# Patient Record
Sex: Female | Born: 1960 | Race: White | Hispanic: No | Marital: Single | Smoking: Never smoker
Health system: Southern US, Community
[De-identification: ages and names within clinical notes are randomized; demographics above are authoritative.]

---

## 2007-04-17 ENCOUNTER — Ambulatory Visit: Payer: Self-pay | Admitting: Gastroenterology

## 2007-05-22 ENCOUNTER — Other Ambulatory Visit: Payer: Self-pay

## 2007-05-22 ENCOUNTER — Ambulatory Visit: Payer: Self-pay | Admitting: General Surgery

## 2007-05-26 ENCOUNTER — Ambulatory Visit: Payer: Self-pay | Admitting: General Surgery

## 2008-01-18 IMAGING — NM NM SENTINAL NODE INJECTION (BREAST) - NO REPORT
1 series · 4 of 4 positions shown · non-contrast
Comparison: none

REASON FOR EXAM: right breast  CA    surg [DATE]
COMMENTS:

[Series 1000: sent node breast static · 2.40mm/px · 2 acquisitions, 4 frames shown]
[im 1/2]
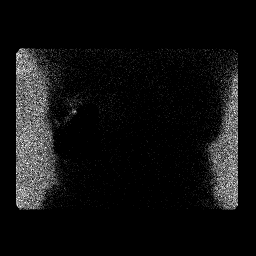
[im 1/2]
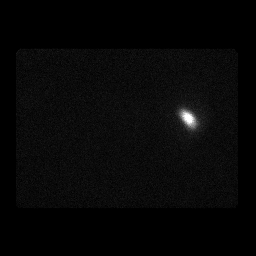
[im 2/2]
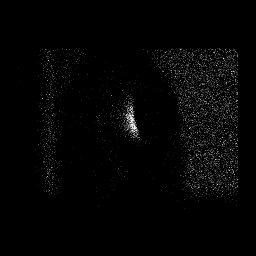
[im 2/2]
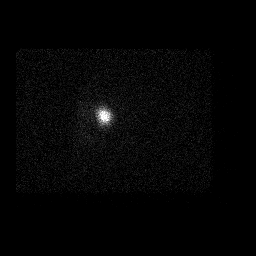

[4 of 4 positions shown; findings below may reference images not displayed]

PROCEDURE:     NM  - NM SENTINEL NODE  BREAST  - May 26, 2007  [DATE]

RESULT:     Comparison: No available comparison exam.

Procedure: Approximately 1.03 mCi technetium 99m sulfur colloid was
administered with single subdermal injection in the lateral peri areolar
region of the right breast. Static images were acquired 15 minutes and 22
minutes after injection.
FINDINGS: Radiotracer accumulation is seen near the injection site.
IMPRESSION: 1. Radiotracer accumulation is seen near the injection site involving the
right breast.

## 2009-02-23 ENCOUNTER — Ambulatory Visit: Payer: Self-pay | Admitting: Ophthalmology

## 2016-06-05 ENCOUNTER — Ambulatory Visit (INDEPENDENT_AMBULATORY_CARE_PROVIDER_SITE_OTHER): Payer: BC Managed Care – PPO

## 2016-06-05 ENCOUNTER — Ambulatory Visit (INDEPENDENT_AMBULATORY_CARE_PROVIDER_SITE_OTHER): Payer: Self-pay | Admitting: Podiatry

## 2016-06-05 ENCOUNTER — Encounter: Payer: Self-pay | Admitting: Podiatry

## 2016-06-05 VITALS — BP 169/95 | HR 70 | Temp 98.2°F | Resp 16 | Ht 61.0 in | Wt 175.0 lb

## 2016-06-05 DIAGNOSIS — R52 Pain, unspecified: Secondary | ICD-10-CM

## 2016-06-05 DIAGNOSIS — M84374A Stress fracture, right foot, initial encounter for fracture: Secondary | ICD-10-CM

## 2016-06-05 DIAGNOSIS — R600 Localized edema: Secondary | ICD-10-CM | POA: Diagnosis not present

## 2016-06-05 NOTE — Progress Notes (Signed)
Patient ID: Caitlin Mosley, female   DOB: [birth year], 58 y.o.   MRN: [id number] Subjective:  Patient presents today as a new patient for evaluation of right foot pain times 1-2 weeks. 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 Patient states that approximately 1-2 weeks ago she started to notice right foot pain. Patient continued to work her normal shifts and she would get intermittent swelling of her right foot. Alleviated by rest. Patient states the pain is progressively gotten worse. Patient presents today for further treatment and evaluation    Objective/Physical Exam General: The patient is alert and oriented x3 in no acute distress.  Dermatology: Skin is warm, dry and supple bilateral lower extremities. Negative for open lesions or macerations.  Vascular: Palpable pedal pulses bilaterally. No edema or erythema noted. Capillary refill within normal limits.  Neurological: Epicritic and protective threshold grossly intact bilaterally.   Musculoskeletal Exam: Severe pain on palpation noted to the base of the second metatarsal right foot. Range of motion within normal limits to all pedal and ankle joints bilateral. Muscle strength 5/5 in all groups bilateral.   Radiographic Exam:  Normal osseous mineralization. Joint spaces preserved. No fracture/dislocation/boney destruction.    Assessment: #1 stress fracture right foot based on clinical exam #2 pain in right foot #3 edema right foot   Plan of Care:  #1 Patient was evaluated. #2 compression anklet dispensed #3 cam boot dispensed #4 continue meloxicam 15 mg #5 discussed the importance of reducing activity and possible time off from work. Patient refuses time off from work at this moment. Recommend immobilization in the cam boot as often as possible. #6 return to clinic in 4 weeks   Edrick Kins, DPM [facility]  Dr. Edrick Kins, [address]                                         [place], [place] [address]                Office [phone]  Fax [phone]

## 2016-06-05 NOTE — Progress Notes (Signed)
   Subjective:    Patient ID: Caitlin Mosley, female    DOB: Oct 06, 1960, 56 y.o.   MRN: [id number]  HPI    Review of Systems  Neurological: Positive for numbness.  All other systems reviewed and are negative.      Objective:   Physical Exam        Assessment & Plan:

## 2016-06-12 DIAGNOSIS — M79673 Pain in unspecified foot: Secondary | ICD-10-CM

## 2016-07-03 ENCOUNTER — Ambulatory Visit (INDEPENDENT_AMBULATORY_CARE_PROVIDER_SITE_OTHER): Payer: BC Managed Care – PPO | Admitting: Podiatry

## 2016-07-03 ENCOUNTER — Ambulatory Visit (INDEPENDENT_AMBULATORY_CARE_PROVIDER_SITE_OTHER): Payer: BC Managed Care – PPO

## 2016-07-03 DIAGNOSIS — M79671 Pain in right foot: Secondary | ICD-10-CM

## 2016-07-03 DIAGNOSIS — M779 Enthesopathy, unspecified: Secondary | ICD-10-CM

## 2016-07-03 DIAGNOSIS — M7751 Other enthesopathy of right foot: Secondary | ICD-10-CM | POA: Diagnosis not present

## 2016-07-03 DIAGNOSIS — M84374D Stress fracture, right foot, subsequent encounter for fracture with routine healing: Secondary | ICD-10-CM

## 2016-07-03 DIAGNOSIS — M778 Other enthesopathies, not elsewhere classified: Secondary | ICD-10-CM

## 2016-07-03 DIAGNOSIS — M84374A Stress fracture, right foot, initial encounter for fracture: Secondary | ICD-10-CM

## 2016-07-14 MED ORDER — BETAMETHASONE SOD PHOS & ACET 6 (3-3) MG/ML IJ SUSP: 3.0000 mg | Freq: Once | INTRAMUSCULAR | Status: AC

## 2016-07-14 NOTE — Progress Notes (Signed)
Patient ID: Caitlin Mosley, female   DOB: 1960/10/14, 56 y.o.   MRN: [id number] Subjective:  Patient presents today for follow-up evaluation of pain to the right midfoot. Patient states they can she continues to have pain and swelling. 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 Pain is alleviated by rest.   Objective/Physical Exam General: The patient is alert and oriented x3 in no acute distress.  Dermatology: Skin is warm, dry and supple bilateral lower extremities. Negative for open lesions or macerations.  Vascular: Palpable pedal pulses bilaterally. No edema or erythema noted. Capillary refill within normal limits.  Neurological: Epicritic and protective threshold grossly intact bilaterally.   Musculoskeletal Exam: Severe pain on palpation noted to the base of the second metatarsal right foot. Range of motion within normal limits to all pedal and ankle joints bilateral. Muscle strength 5/5 in all groups bilateral.    Assessment: #1 stress fracture right foot base of the fourth metatarsal based on clinical exam #2 pain in right foot #3 edema right foot   Plan of Care:  #1 Patient was evaluated. #2 continue compression anklet. Continue cam boot as much as possible. #3 continue meloxicam 15 mg #4 injection of 0.5 mL Celestone Soluspan injected to the patient and foot at the base the fourth metatarsal/tarsal metatarsal joint #5 return to clinic in 4 weeks   Edrick Kins, DPM [facility]  Dr. Edrick Kins, [address] [place]                                        [place], [place] [address]                Office [phone]  Fax [phone]

## 2016-07-27 ENCOUNTER — Encounter: Payer: Self-pay | Admitting: Podiatry

## 2016-07-31 ENCOUNTER — Ambulatory Visit (INDEPENDENT_AMBULATORY_CARE_PROVIDER_SITE_OTHER): Payer: BC Managed Care – PPO | Admitting: Podiatry

## 2016-07-31 ENCOUNTER — Ambulatory Visit (INDEPENDENT_AMBULATORY_CARE_PROVIDER_SITE_OTHER): Payer: BC Managed Care – PPO

## 2016-07-31 DIAGNOSIS — M84374D Stress fracture, right foot, subsequent encounter for fracture with routine healing: Secondary | ICD-10-CM

## 2016-07-31 MED ORDER — NONFORMULARY OR COMPOUNDED ITEM: 2 refills | Status: AC

## 2016-07-31 MED ORDER — MELOXICAM 15 MG PO TABS: 15.0000 mg | ORAL_TABLET | Freq: Every day | ORAL | 1 refills | Status: DC

## 2016-08-13 NOTE — Progress Notes (Signed)
Patient ID: Caitlin Mosley, female   DOB: 09/10/1960, 56 y.o.   MRN: [id number] Subjective:  Patient presents today for follow-up evaluation of possible stress fractures the right foot base of the fourth metatarsal base on clinical exam. Patient states that she feels like getting better. Compressive stocking helped. 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 Pain is alleviated by rest.   Objective/Physical Exam General: The patient is alert and oriented x3 in no acute distress.  Dermatology: Skin is warm, dry and supple bilateral lower extremities. Negative for open lesions or macerations.  Vascular: Palpable pedal pulses bilaterally. No edema or erythema noted. Capillary refill within normal limits.  Neurological: Epicritic and protective threshold grossly intact bilaterally.   Musculoskeletal Exam: pain on palpation noted to the base of the fourth metatarsal right foot. Range of motion within normal limits to all pedal and ankle joints bilateral. Muscle strength 5/5 in all groups bilateral.    Assessment: #1 stress fracture right foot base of the fourth metatarsal based on clinical exam - improving #2 pain in right foot   Plan of Care:  #1 Patient was evaluated. #2 continue compression anklet. Continue cam boot as much as possible. #3 refill prescription for meloxicam 15 mg #4 prescription for anti-inflammatory pain cream through [facility]  #5 patient received an injection last visit on 07/03/2016. #6 return to clinic in 4 weeks   Edrick Kins, DPM [facility]  Dr. Edrick Kins, [address]                                        [place],  [address]                Office [phone]  Fax [phone]

## 2016-08-28 ENCOUNTER — Ambulatory Visit (INDEPENDENT_AMBULATORY_CARE_PROVIDER_SITE_OTHER): Payer: BC Managed Care – PPO

## 2016-08-28 ENCOUNTER — Ambulatory Visit (INDEPENDENT_AMBULATORY_CARE_PROVIDER_SITE_OTHER): Payer: BC Managed Care – PPO | Admitting: Podiatry

## 2016-08-28 DIAGNOSIS — M7751 Other enthesopathy of right foot: Secondary | ICD-10-CM

## 2016-08-28 DIAGNOSIS — M84374D Stress fracture, right foot, subsequent encounter for fracture with routine healing: Secondary | ICD-10-CM

## 2016-08-28 DIAGNOSIS — M778 Other enthesopathies, not elsewhere classified: Secondary | ICD-10-CM

## 2016-08-28 DIAGNOSIS — M779 Enthesopathy, unspecified: Secondary | ICD-10-CM

## 2016-08-31 MED ORDER — BETAMETHASONE SOD PHOS & ACET 6 (3-3) MG/ML IJ SUSP: 3.0000 mg | Freq: Once | INTRAMUSCULAR | Status: AC

## 2016-08-31 NOTE — Progress Notes (Signed)
Patient ID: Caitlin Mosley, female   DOB: [birth year], 56 y.o.   MRN: [id number] Subjective:  Patient presents today for follow-up evaluation of pain to the right foot. Patient states the pain was improving significantly until approximately one week ago when her grandson and fell on her foot. Patient states that the compression socks in compounding cream is helping some. 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 Pain is alleviated by rest.   Objective/Physical Exam General: The patient is alert and oriented x3 in no acute distress.  Dermatology: Skin is warm, dry and supple bilateral lower extremities. Negative for open lesions or macerations.  Vascular: Palpable pedal pulses bilaterally. No edema or erythema noted. Capillary refill within normal limits.  Neurological: Epicritic and protective threshold grossly intact bilaterally.   Musculoskeletal Exam: pain on palpation noted to the base of the fourth metatarsal right foot. Range of motion within normal limits to all pedal and ankle joints bilateral. Muscle strength 5/5 in all groups bilateral.  Pain on palpation noted today to the tarsometatarsal joint right foot consistent with a midfoot capsulitis.  Assessment: #1 stress fracture right foot base of the fourth metatarsal based on clinical exam - improving #2 tarsometatarsal joint capsulitis right foot   Plan of Care:  #1 Patient was evaluated. #2 continue compression anklet. Continue cam boot for one more week. After 1 week she can begin to transition into good supportive sneakers. #3 continue meloxicam when necessary #4 continue anti-inflammatory pain cream through [facility]  #5 injection of 0.5 mL Celestone Soluspan injected into the tarsometatarsal joint/mid foot right #6 return to clinic in 4 weeks  Edrick Kins, DPM [facility]  Dr. Edrick Kins, [address]                                        [place], [place]  [address]                Office [phone]  Fax [phone]

## 2016-09-25 ENCOUNTER — Ambulatory Visit: Payer: BC Managed Care – PPO | Admitting: Podiatry

## 2017-05-01 DIAGNOSIS — R600 Localized edema: Secondary | ICD-10-CM | POA: Insufficient documentation

## 2018-03-04 ENCOUNTER — Ambulatory Visit (INDEPENDENT_AMBULATORY_CARE_PROVIDER_SITE_OTHER): Payer: BC Managed Care – PPO

## 2018-03-04 ENCOUNTER — Ambulatory Visit: Payer: BC Managed Care – PPO | Admitting: Podiatry

## 2018-03-04 ENCOUNTER — Encounter: Payer: Self-pay | Admitting: Podiatry

## 2018-03-04 ENCOUNTER — Other Ambulatory Visit: Payer: Self-pay | Admitting: Podiatry

## 2018-03-04 DIAGNOSIS — M779 Enthesopathy, unspecified: Secondary | ICD-10-CM

## 2018-03-04 DIAGNOSIS — M778 Other enthesopathies, not elsewhere classified: Secondary | ICD-10-CM

## 2018-03-04 DIAGNOSIS — M79672 Pain in left foot: Secondary | ICD-10-CM

## 2018-03-06 NOTE — Progress Notes (Signed)
   HPI: 57 year old female presenting today with a chief complaint of left foot pain that began 1-2 weeks ago. She states she was walking across a parking lot and her feet "went sideways" causing pain to the mid arch and dorsal aspect of the left foot. Wearing shoes increases the pain. She has been taking Meloxicam for treatment of the symptoms. Patient is here for further evaluation and treatment.   History reviewed. No pertinent past medical history.   Physical Exam: General: The patient is alert and oriented x3 in no acute distress.  Dermatology: Skin is warm, dry and supple bilateral lower extremities. Negative for open lesions or macerations.  Vascular: Palpable pedal pulses bilaterally. No edema or erythema noted. Capillary refill within normal limits.  Neurological: Epicritic and protective threshold grossly intact bilaterally.   Musculoskeletal Exam: Pain with palpation to the lateral left midfoot. Range of motion within normal limits to all pedal and ankle joints bilateral. Muscle strength 5/5 in all groups bilateral.   Radiographic Exam:  Normal osseous mineralization. Joint spaces preserved. No fracture/dislocation/boney destruction.    Assessment: 1. Lateral left midfoot capsulitis    Plan of Care:  1. Patient evaluated. X-Rays reviewed.  2. Injection of 0.5 mLs Celestone Soluspan injected into the lateral left midfoot.  3. Continue taking Meloxicam daily.  4. Continue wearing [employer] sneakers.  5. Return to clinic as needed.       Edrick Kins, DPM [facility]  Dr. Edrick Kins, DPM    [address], [place] [address]                Office [phone]  Fax [phone]

## 2018-05-13 DIAGNOSIS — G43009 Migraine without aura, not intractable, without status migrainosus: Secondary | ICD-10-CM | POA: Insufficient documentation

## 2019-02-11 DIAGNOSIS — C50911 Malignant neoplasm of unspecified site of right female breast: Secondary | ICD-10-CM | POA: Insufficient documentation

## 2019-02-11 DIAGNOSIS — E785 Hyperlipidemia, unspecified: Secondary | ICD-10-CM | POA: Insufficient documentation

## 2019-02-11 DIAGNOSIS — G4700 Insomnia, unspecified: Secondary | ICD-10-CM | POA: Insufficient documentation

## 2019-02-11 DIAGNOSIS — K219 Gastro-esophageal reflux disease without esophagitis: Secondary | ICD-10-CM | POA: Insufficient documentation

## 2019-02-11 DIAGNOSIS — G629 Polyneuropathy, unspecified: Secondary | ICD-10-CM | POA: Insufficient documentation

## 2019-02-12 ENCOUNTER — Ambulatory Visit: Payer: BC Managed Care – PPO | Admitting: Urology

## 2019-02-12 ENCOUNTER — Encounter: Payer: Self-pay | Admitting: Urology

## 2019-02-12 ENCOUNTER — Other Ambulatory Visit: Payer: Self-pay

## 2019-02-12 VITALS — BP 143/88 | HR 68 | Ht 60.0 in | Wt 143.0 lb

## 2019-02-12 DIAGNOSIS — R339 Retention of urine, unspecified: Secondary | ICD-10-CM | POA: Diagnosis not present

## 2019-02-12 DIAGNOSIS — R3911 Hesitancy of micturition: Secondary | ICD-10-CM | POA: Diagnosis not present

## 2019-02-12 LAB — BLADDER SCAN AMB NON-IMAGING

## 2019-02-12 MED ORDER — TAMSULOSIN HCL 0.4 MG PO CAPS: 0.4000 mg | ORAL_CAPSULE | Freq: Every day | ORAL | 0 refills | Status: DC

## 2019-02-12 NOTE — Progress Notes (Signed)
02/12/2019 9:04 PM   Caitlin Mosley Dec 05, 1960 [id number]  Referring provider: Gayland Curry, MD [address] [place],  [place] [address]  Chief Complaint  Patient presents with  . Urinary Retention    HPI: Caitlin Mosley is a 58 y.o. female seen in consultation at the request of Dr. Astrid Divine for evaluation of lower urinary tract symptoms.  She presents with an 8-77-month history of a slow urinary stream with dribbling.  Her symptoms are worse in the morning.  She does have some suprapubic pressure and voiding small amounts.  She drinks 1-2 gallons of water per day.  She does have nocturia x2-3 however is drinking water up until bedtime.  She denies any bulging of tissue per vagina.  She has had a prior hysterectomy.  Denies dysuria or gross hematuria.  Denies flank, abdominal or pelvic pain.  No history of recurrent UTIs.   PMH: No past medical history on file.  Surgical History: No past surgical history on file.  Home Medications:  Allergies as of 02/12/2019      Reactions   Tetanus-diphtheria Toxoids Td Anaphylaxis   Tioconazole Swelling      Medication List       Accurate as of February 12, 2019  9:04 PM. If you have any questions, ask your nurse or doctor.        hydrochlorothiazide 25 MG tablet Commonly known as: HYDRODIURIL Take by mouth.   Melatonin 10 MG Tabs Take by mouth.   meloxicam 7.5 MG tablet Commonly known as: MOBIC TK 1 T PO QD PRF HAND ARTHRITIS What changed: Another medication with the same name was removed. Continue taking this medication, and follow the directions you see here. Changed by: Abbie Sons, MD   NONFORMULARY OR COMPOUNDED ITEM See pharmacy notes   OMEGA-3 FATTY ACIDS PO Take by mouth.   omeprazole 20 MG capsule Commonly known as: PRILOSEC TK 1 C PO D.   pantoprazole 40 MG tablet Commonly known as: PROTONIX Take by mouth.   scopolamine 1 MG/3DAYS Commonly known as: TRANSDERM-SCOP PLACE 1 PATCH ONTO THE  SKIN Q 3RD DAY WHEN TRAVELING   topiramate 25 MG tablet Commonly known as: TOPAMAX       Allergies:  Allergies  Allergen Reactions  . Tetanus-Diphtheria Toxoids Td Anaphylaxis  . Tioconazole Swelling    Family History: No family history on file.  Social History:  reports that she has never smoked. She has never used smokeless tobacco. She reports that she does not drink alcohol or use drugs.  ROS: UROLOGY Frequent Urination?: Yes Hard to postpone urination?: Yes Burning/pain with urination?: No Get up at night to urinate?: Yes(x 3) Leakage of urine?: No Urine stream starts and stops?: No Trouble starting stream?: No Do you have to strain to urinate?: Yes Blood in urine?: No Urinary tract infection?: No Sexually transmitted disease?: No Injury to kidneys or bladder?: No Painful intercourse?: No Weak stream?: No Currently pregnant?: No Vaginal bleeding?: No Last menstrual period?: N/A  Gastrointestinal Nausea?: No Vomiting?: No Indigestion/heartburn?: No Diarrhea?: No Constipation?: No  Constitutional Fever: No Night sweats?: No Weight loss?: No Fatigue?: No  Skin Skin rash/lesions?: No Itching?: No  Eyes Blurred vision?: No Double vision?: No  Ears/Nose/Throat Sore throat?: No Sinus problems?: No  Hematologic/Lymphatic Swollen glands?: No Easy bruising?: No  Cardiovascular Leg swelling?: No Chest pain?: No  Respiratory Cough?: No Shortness of breath?: No  Endocrine Excessive thirst?: No  Musculoskeletal Back pain?: No Joint pain?: No  Neurological  Headaches?: No Dizziness?: No  Psychologic Depression?: No Anxiety?: No  Physical Exam: BP (!) 143/88 (BP Location: Left Arm, Patient Position: Sitting, Cuff Size: Normal)   Pulse 68   Ht 5' (1.524 m)   Wt 143 lb (64.9 kg)   BMI 27.93 kg/m   Constitutional:  Alert and oriented, No acute distress. HEENT: [place] AT, moist mucus membranes.  Trachea midline, no masses.  Cardiovascular: No clubbing, cyanosis, or edema. Respiratory: Normal respiratory effort, no increased work of breathing. GI: Abdomen is soft, nontender, nondistended, no abdominal masses GU: External genitalia normal in appearance.  Vaginal mucosa slightly atrophic.  Grade 2 cystocele present, minimal rectocele Lymph: No cervical or inguinal lymphadenopathy. Skin: No rashes, bruises or suspicious lesions. Neurologic: Grossly intact, no focal deficits, moving all 4 extremities. Psychiatric: Normal mood and affect.  Laboratory Data:  Urinalysis Dipstick/microscopy negative   Assessment & Plan:    - Obstructive voiding symptoms PVR by bladder scan was 0 mL.  She does drink 1-2 gallons per day and her a.m. symptoms may be secondary to bladder overstretch.  We discussed outlet obstruction and women was not common however she was interested in a trial of tamsulosin to see her for voiding symptoms improved.  Rx was sent to pharmacy.  She will call back in 30 days to let us know how the medication is working.   Abbie Sons, [facility] [address], [place], [place] [address] [phone]

## 2019-02-13 LAB — URINALYSIS, COMPLETE
Bilirubin, UA: NEGATIVE
Glucose, UA: NEGATIVE
Ketones, UA: NEGATIVE
Leukocytes,UA: NEGATIVE
Nitrite, UA: NEGATIVE
Protein,UA: NEGATIVE
Specific Gravity, UA: 1.015 (ref 1.005–1.030)
Urobilinogen, Ur: 0.2 mg/dL (ref 0.2–1.0)
pH, UA: 6 (ref 5.0–7.5)

## 2019-02-13 LAB — MICROSCOPIC EXAMINATION: Bacteria, UA: NONE SEEN

## 2019-03-17 ENCOUNTER — Other Ambulatory Visit: Payer: Self-pay | Admitting: *Deleted

## 2019-03-17 DIAGNOSIS — R3911 Hesitancy of micturition: Secondary | ICD-10-CM

## 2019-03-17 MED ORDER — TAMSULOSIN HCL 0.4 MG PO CAPS: 0.4000 mg | ORAL_CAPSULE | Freq: Every day | ORAL | 11 refills | Status: AC

## 2019-03-17 NOTE — Telephone Encounter (Signed)
Patient states the Tamsulosin is working great

## 2024-01-28 ENCOUNTER — Other Ambulatory Visit: Payer: Self-pay | Admitting: Family Medicine

## 2024-01-28 DIAGNOSIS — E782 Mixed hyperlipidemia: Secondary | ICD-10-CM

## 2024-01-31 ENCOUNTER — Ambulatory Visit
Admission: RE | Admit: 2024-01-31 | Discharge: 2024-01-31 | Disposition: A | Discharge status: Home / Self Care | Payer: Self-pay | Source: Ambulatory Visit | Attending: Family Medicine | Admitting: Family Medicine

## 2024-01-31 DIAGNOSIS — E782 Mixed hyperlipidemia: Secondary | ICD-10-CM | POA: Insufficient documentation
